# Patient Record
Sex: Female | Born: 2000 | Race: White | Hispanic: No | Marital: Single | Smoking: Former smoker
Health system: Southern US, Community
[De-identification: ages and names within clinical notes are randomized; demographics above are authoritative.]

---
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
## 2020-04-23 ENCOUNTER — Encounter: Payer: Self-pay | Admitting: Emergency Medicine

## 2020-04-23 ENCOUNTER — Ambulatory Visit (INDEPENDENT_AMBULATORY_CARE_PROVIDER_SITE_OTHER): Payer: Medicaid Other

## 2020-04-23 ENCOUNTER — Other Ambulatory Visit: Payer: Self-pay

## 2020-04-23 ENCOUNTER — Ambulatory Visit
Admission: EM | Admit: 2020-04-23 | Discharge: 2020-04-23 | Disposition: A | Discharge status: Home / Self Care | Payer: Medicaid Other | Attending: Internal Medicine | Admitting: Internal Medicine

## 2020-04-23 DIAGNOSIS — M25541 Pain in joints of right hand: Secondary | ICD-10-CM | POA: Diagnosis not present

## 2020-04-23 DIAGNOSIS — S60221A Contusion of right hand, initial encounter: Secondary | ICD-10-CM | POA: Diagnosis not present

## 2020-04-23 DIAGNOSIS — W2201XA Walked into wall, initial encounter: Secondary | ICD-10-CM

## 2020-04-23 MED ORDER — IBUPROFEN 600 MG PO TABS: 600.0000 mg | ORAL_TABLET | Freq: Four times a day (QID) | ORAL | 0 refills | Status: AC | PRN

## 2020-04-23 NOTE — ED Provider Notes (Signed)
[facility]    CSN: [id number] Arrival date & time: 04/23/20  1101      History   Chief Complaint No chief complaint on file.   HPI Grace Riggs is a 20 y.o. female comes to the urgent care with right hand pain which started last night.  Patient punched a solid wall last night.  She punched a wall this morning as well.  Patient points to wall because she was angry and frustrated with the domestic situation.  Following that the patient started experiencing severe pain in the right hand over the third and fourth metacarpophalangeal joints.  She is unable to make a fist.  No numbness or tingling in the fingers.  Pain is aggravated by movement and relieved by rest.  It is associated with mild bruising but no bleeding.Marland Kitchen   HPI  History reviewed. No pertinent past medical history.  There are no problems to display for this patient.   History reviewed. No pertinent surgical history.  OB History   No obstetric history on file.      Home Medications    Prior to Admission medications   Medication Sig Start Date End Date Taking? Authorizing Provider  busPIRone (BUSPAR) 10 MG tablet Take 10 mg by mouth 3 (three) times daily.   Yes [provider]  cyclophosphamide (CYTOXAN) 25 MG capsule Take 40 mg by mouth daily. Take with food to minimize GI upset. Take early in the day and maintain hydration.   Yes [provider]  ibuprofen (ADVIL) 600 MG tablet Take 1 tablet (600 mg total) by mouth every 6 (six) hours as needed. 04/23/20  Yes Cinque Begley, Britta Mccreedy, MD    Family History History reviewed. No pertinent family history.  Social History Social History   Tobacco Use  . Smoking status: Former Games developer  . Smokeless tobacco: Never Used  Vaping Use  . Vaping Use: Former  Substance Use Topics  . Alcohol use: Yes    Comment: drinks once a week  . Drug use: Yes    Types: Marijuana    Comment: daily     Allergies   Patient has no known  allergies.   Review of Systems Review of Systems  Respiratory: Negative.   Cardiovascular: Negative.   Genitourinary: Negative.   Musculoskeletal: Positive for arthralgias, joint swelling and myalgias.  Skin: Negative.   Neurological: Negative.      Physical Exam Triage Vital Signs ED Triage Vitals  Enc Vitals Group     BP 04/23/20 1109 118/90     Pulse Rate 04/23/20 1109 (!) 107     Resp 04/23/20 1109 18     Temp 04/23/20 1109 98.6 F (37 C)     Temp Source 04/23/20 1109 Oral     SpO2 04/23/20 1109 97 %     Weight --      Height --      Head Circumference --      Peak Flow --      Pain Score 04/23/20 1111 0     Pain Loc --      Pain Edu? --      Excl. in GC? --    No data found.  Updated Vital Signs BP 118/90 (BP Location: Right Arm)   Pulse (!) 107   Temp 98.6 F (37 C) (Oral)   Resp 18   LMP 04/12/2020   SpO2 97%   Visual Acuity Right Eye Distance:   Left Eye Distance:   Bilateral Distance:  Right Eye Near:   Left Eye Near:    Bilateral Near:     Physical Exam Vitals and nursing note reviewed.  Constitutional:      General: She is not in acute distress.    Appearance: She is not ill-appearing.  Cardiovascular:     Rate and Rhythm: Normal rate and regular rhythm.  Pulmonary:     Effort: Pulmonary effort is normal.     Breath sounds: Normal breath sounds.  Musculoskeletal:     Comments: Tenderness on palpation over the third and fourth metacarpophalangeal joints of the right hand.  Mild bruising over the dorsum of the right hand.  Neurological:     Mental Status: She is alert.      UC Treatments / Results  Labs (all labs ordered are listed, but only abnormal results are displayed) Labs Reviewed - No data to display  EKG   Radiology DG Hand Complete Right  Result Date: 04/23/2020 CLINICAL DATA:  Pain after punching wall EXAM: RIGHT HAND - COMPLETE 3+ VIEW COMPARISON:  None. FINDINGS: Frontal, oblique, and lateral views were  obtained. No fracture or dislocation. Joint spaces appear normal. No erosive change. IMPRESSION: No fracture or dislocation.  No evident arthropathy. Electronically Signed   By: Bretta Bang III M.D.   On: 04/23/2020 11:42    Procedures Procedures (including critical care time)  Medications Ordered in UC Medications - No data to display  Initial Impression / Assessment and Plan / UC Course  I have reviewed the triage vital signs and the nursing notes.  Pertinent labs & imaging results that were available during my care of the patient were reviewed by me and considered in my medical decision making (see chart for details).     1.  Contusion of the right hand: X-ray of the right hand is negative for fracture or dislocation Ace wrap advised Gentle range of motion exercises Ibuprofen 600 mg every 6 hours as needed for pain Ice right hand Return to urgent care if symptoms worsen.   Final Clinical Impressions(s) / UC Diagnoses   Final diagnoses:  Contusion of right hand, initial encounter     Discharge Instructions     Gentle range of motion exercises Ace wrap helpful for pain control Take medications as directed Return to urgent care if symptoms worsen   ED Prescriptions    Medication Sig Dispense Auth. Provider   ibuprofen (ADVIL) 600 MG tablet Take 1 tablet (600 mg total) by mouth every 6 (six) hours as needed. 30 tablet Ellie Bryand, Britta Mccreedy, MD     PDMP not reviewed this encounter.   Merrilee Jansky, MD 04/23/20 1155

## 2020-04-23 NOTE — Discharge Instructions (Signed)
Gentle range of motion exercises Ace wrap helpful for pain control Take medications as directed Return to urgent care if symptoms worsen

## 2020-04-23 NOTE — ED Triage Notes (Signed)
Punched a wall yesterday and then again this morning.  Unable to make a fist with right hand.  Pain and bruising.

## 2021-12-07 IMAGING — DX DG HAND COMPLETE 3+V*R*
3 series · 3 of 3 positions shown · non-contrast
Comparison: None.

CLINICAL DATA: Pain after punching wall

EXAM:
RIGHT HAND - COMPLETE 3+ VIEW

[hand pa]
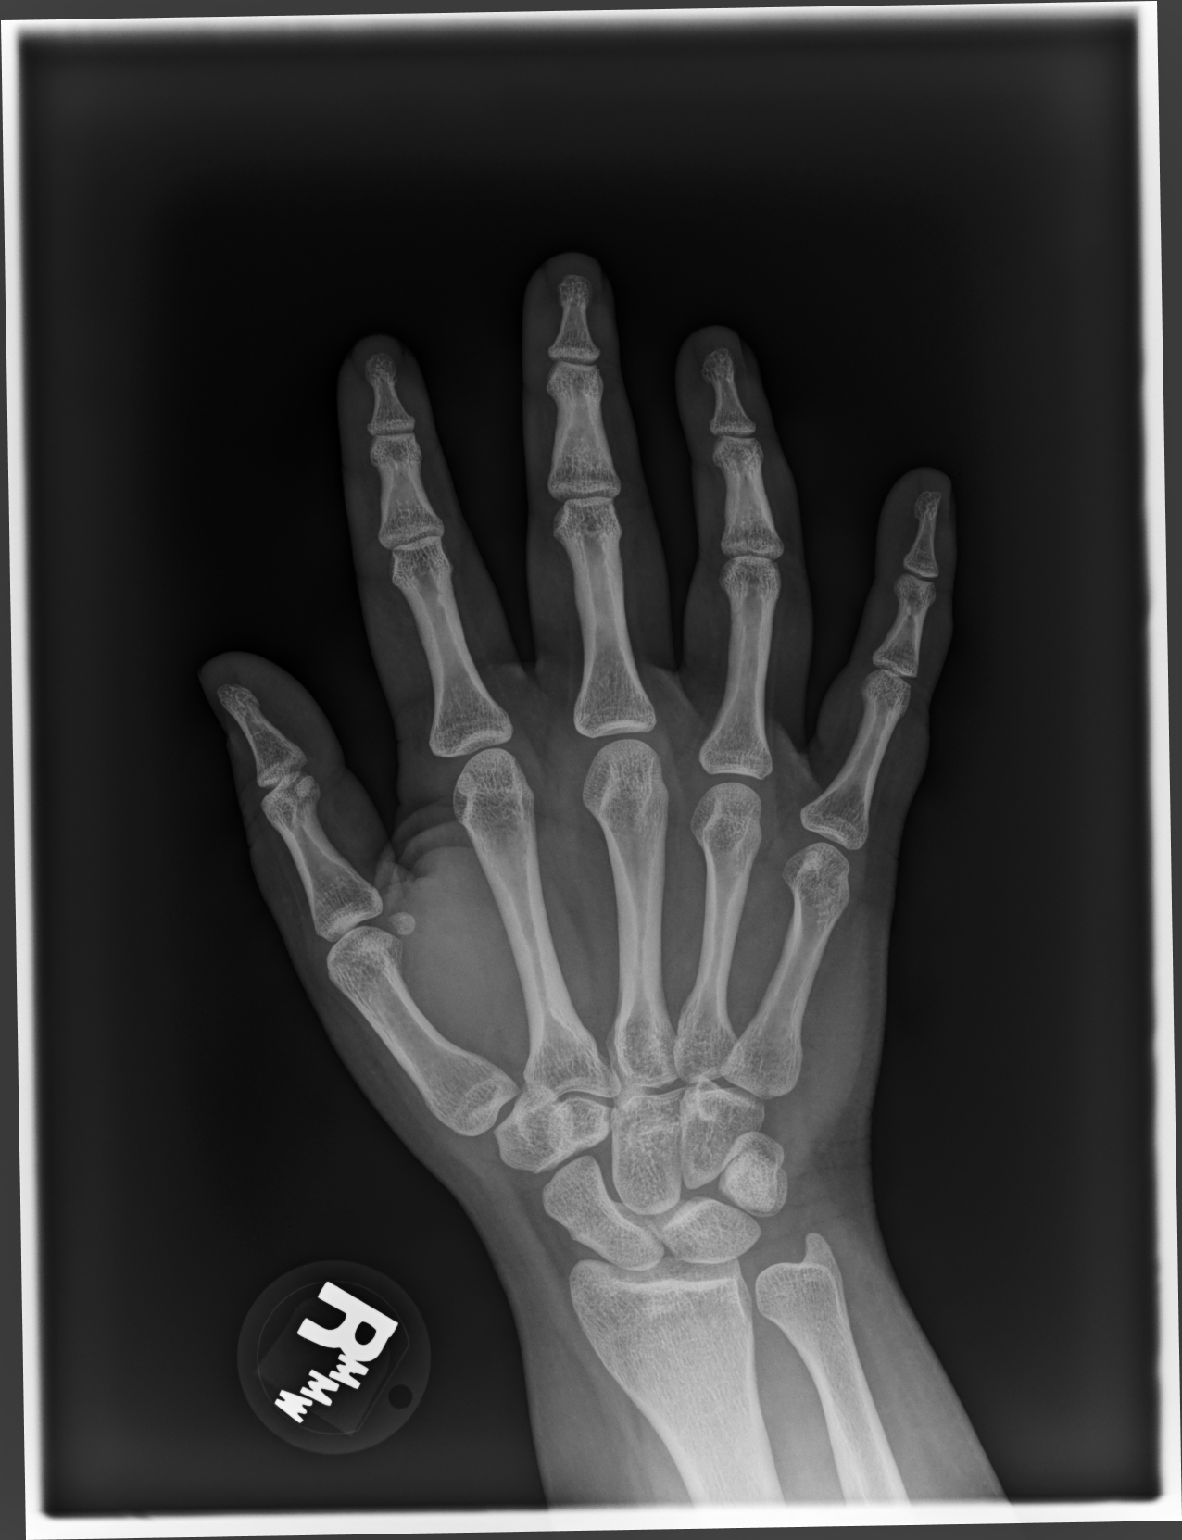

[hand mlo]
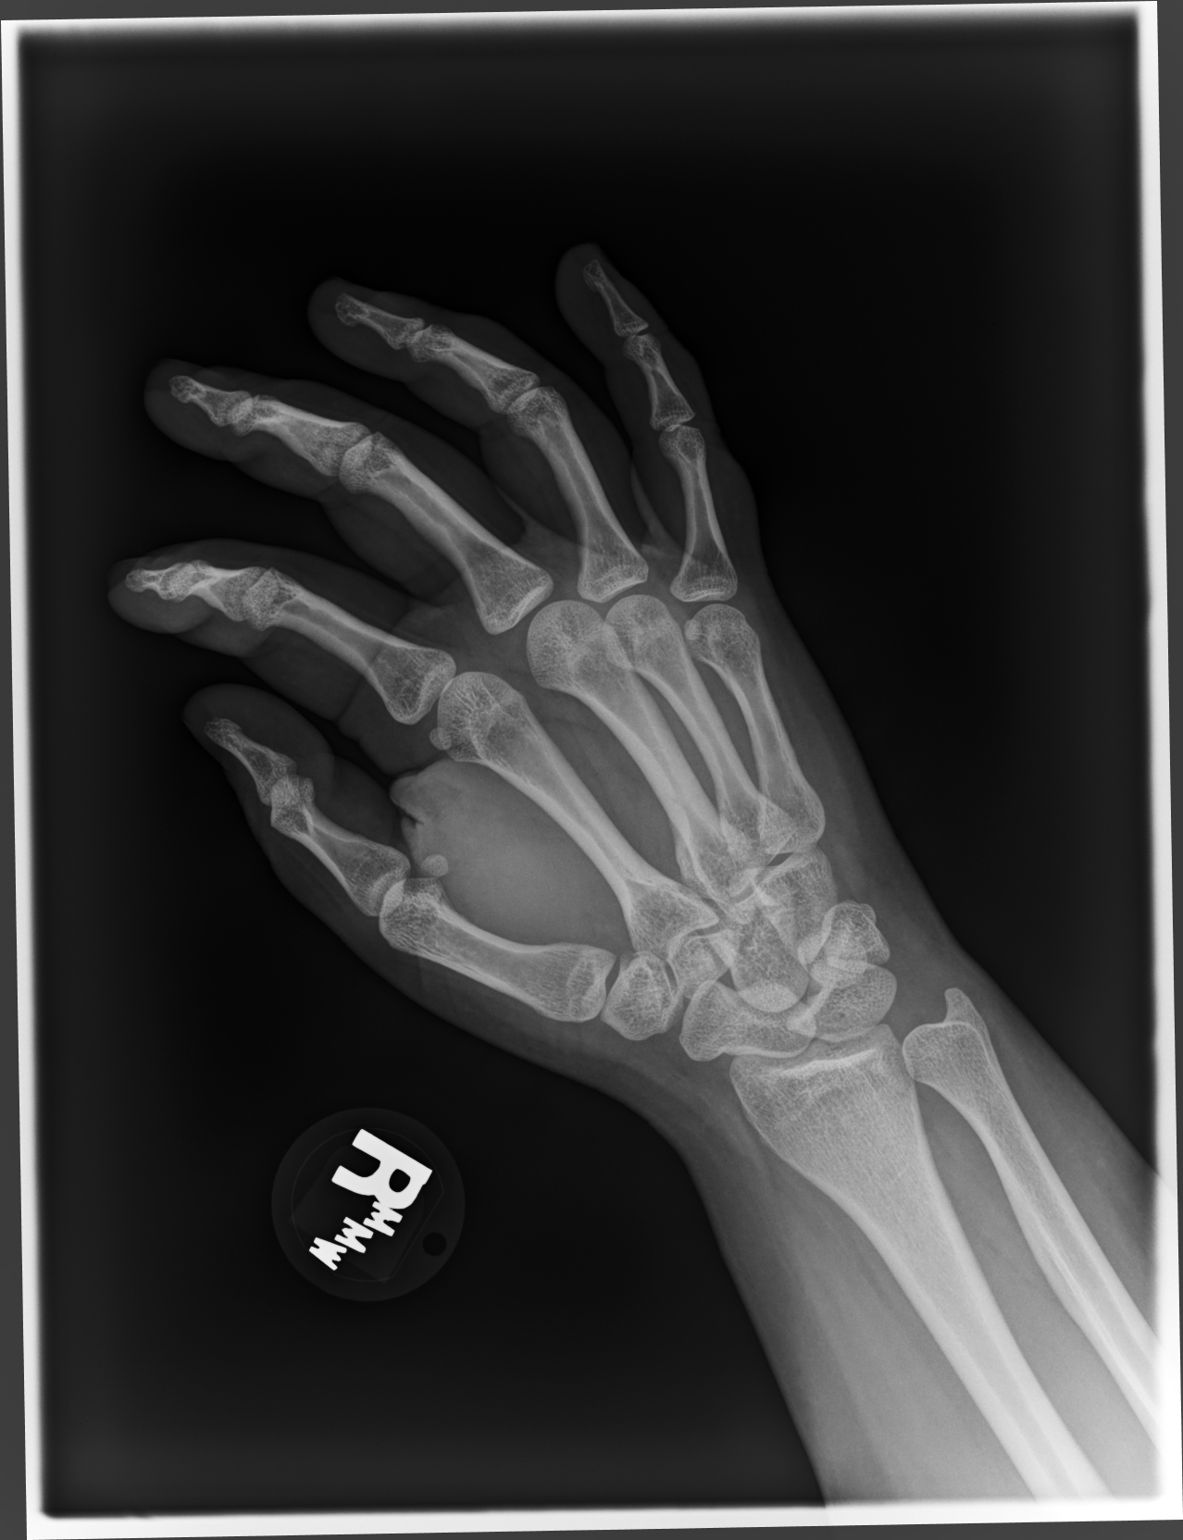

[hand lat]
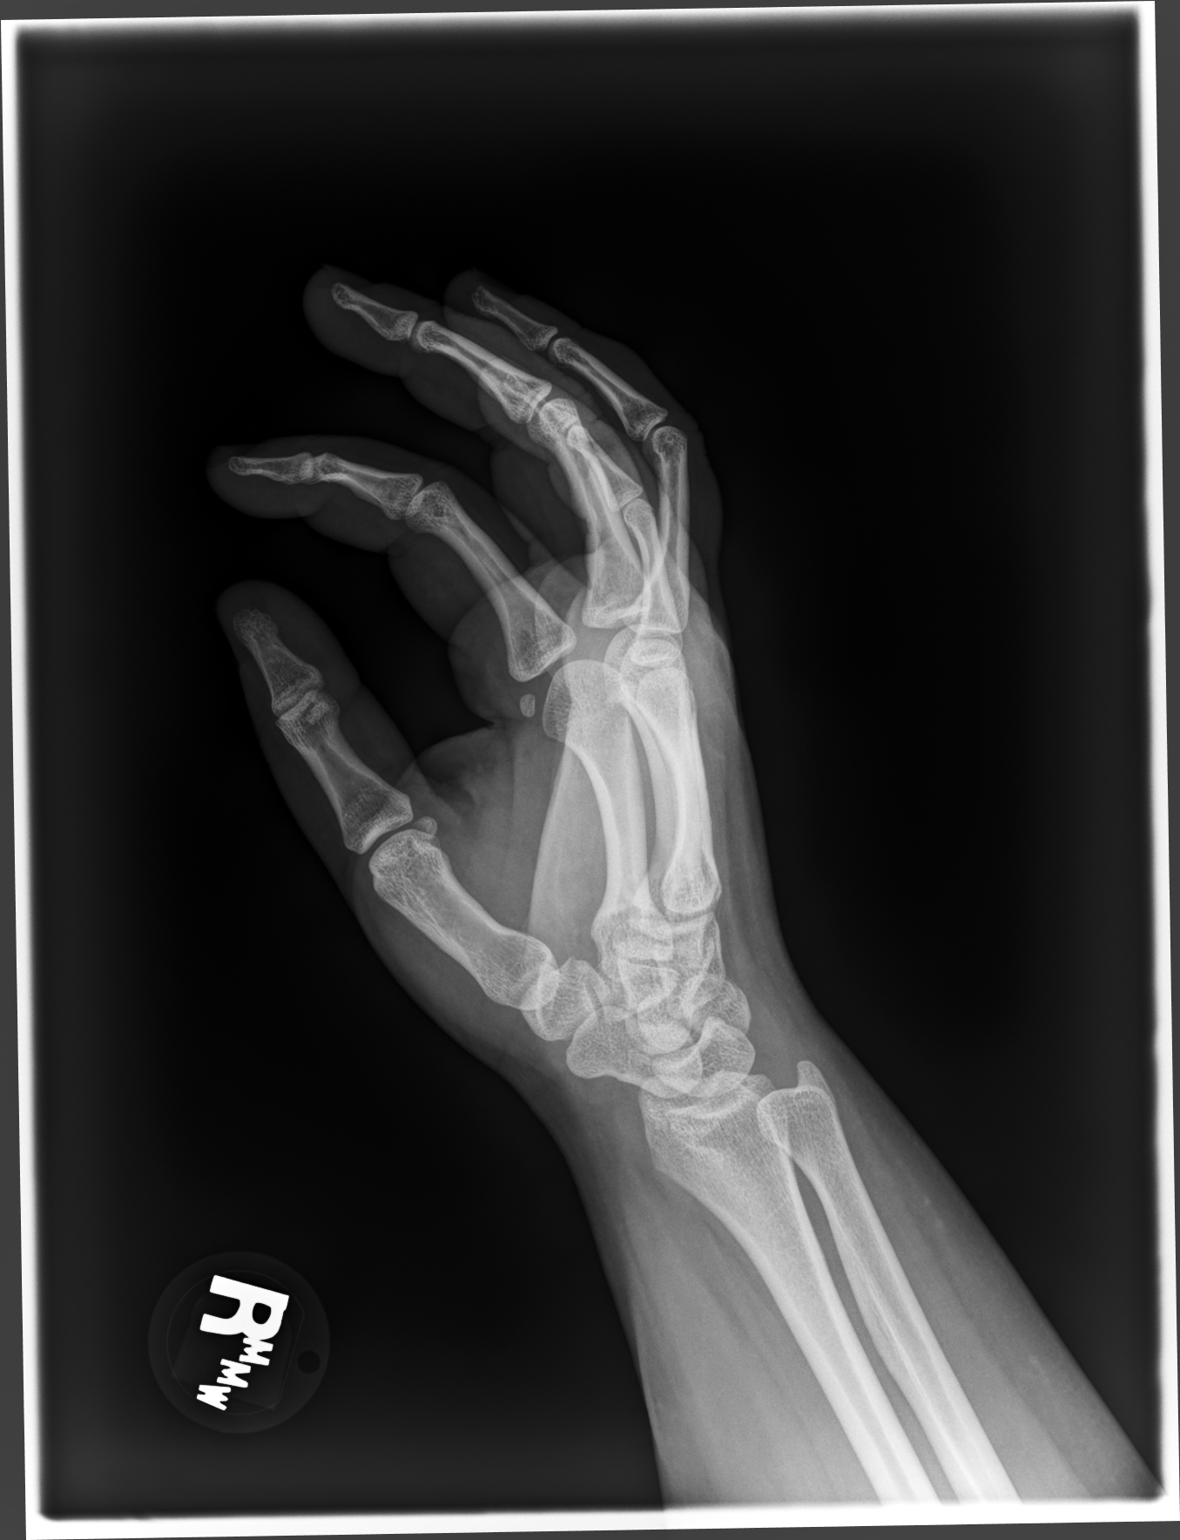

[3 of 3 positions shown; findings below may reference images not displayed]

FINDINGS: Frontal, oblique, and lateral views were obtained. No fracture or
dislocation. Joint spaces appear normal. No erosive change.
IMPRESSION: No fracture or dislocation.  No evident arthropathy.

## 2024-01-01 LAB — PANORAMA PRENATAL TEST FULL PANEL:PANORAMA TEST PLUS 5 ADDITIONAL MICRODELETIONS: FETAL FRACTION: 5.4
